# Patient Record
Sex: Female | Born: 1980 | Race: White | Hispanic: No | Marital: Married | Smoking: Never smoker
Health system: Southern US, Community
[De-identification: ages and names within clinical notes are randomized; demographics above are authoritative.]

## PROBLEM LIST (undated history)

## (undated) DIAGNOSIS — T7840XA Allergy, unspecified, initial encounter: Secondary | ICD-10-CM

## (undated) HISTORY — DX: Allergy, unspecified, initial encounter: T78.40XA

## (undated) HISTORY — PX: TONSILLECTOMY: SUR1361

---

## 2003-09-25 ENCOUNTER — Emergency Department (HOSPITAL_COMMUNITY): Admission: EM | Admit: 2003-09-25 | Discharge: 2003-09-25 | Payer: Self-pay | Admitting: Emergency Medicine

## 2006-01-17 ENCOUNTER — Ambulatory Visit: Payer: Self-pay | Admitting: Family Medicine

## 2006-02-11 ENCOUNTER — Other Ambulatory Visit: Admission: RE | Admit: 2006-02-11 | Discharge: 2006-02-11 | Payer: Self-pay | Admitting: Family Medicine

## 2006-02-11 ENCOUNTER — Encounter (INDEPENDENT_AMBULATORY_CARE_PROVIDER_SITE_OTHER): Payer: Self-pay | Admitting: Internal Medicine

## 2006-02-11 ENCOUNTER — Ambulatory Visit: Payer: Self-pay | Admitting: Family Medicine

## 2006-02-11 LAB — CONVERTED CEMR LAB: Pap Smear: NORMAL

## 2008-08-18 ENCOUNTER — Encounter (INDEPENDENT_AMBULATORY_CARE_PROVIDER_SITE_OTHER): Payer: Self-pay | Admitting: Internal Medicine

## 2008-08-18 DIAGNOSIS — K219 Gastro-esophageal reflux disease without esophagitis: Secondary | ICD-10-CM | POA: Insufficient documentation

## 2009-08-10 ENCOUNTER — Ambulatory Visit: Payer: Self-pay | Admitting: Family Medicine

## 2009-08-10 DIAGNOSIS — J111 Influenza due to unidentified influenza virus with other respiratory manifestations: Secondary | ICD-10-CM

## 2010-08-08 NOTE — Letter (Signed)
Summary: Out of Work  [facility]  [address] [place], [place] [address]   Phone: [phone]  Fax: [phone]    August 10, 2009   Employee:  LELER BRION Mara    To Whom It May Concern:   For Medical reasons, please excuse the above named employee from work for the following dates:  Start:   August 10, 2009  End:   August 15, 2009  If you need additional information, please feel free to contact our office.         Sincerely,    Ruthe Mannan MD

## 2010-08-08 NOTE — Assessment & Plan Note (Signed)
Summary: flu???/per dr. Alphonsus Sias alc   Vital Signs:  Patient profile:   30 year old female Height:      66 inches Weight:      125.50 pounds BMI:     20.33 Temp:     99 degrees F oral Pulse rate:   88 / minute Pulse rhythm:   regular BP sitting:   88 / 62  (left arm) Cuff size:   regular  Vitals Entered By: Delilah Shan CMA Duncan Dull) (August 10, 2009 9:42 AM) CC: ? Flu ? per Dr. Alphonsus Sias   History of Present Illness: 30 yo female with 1 day of subjective fevers, body aches, sneezing, and dry cough. Extremem malaise, woke up with symptoms yeterday. Friend who she was with all weekend diagnosed with influenza B. No wheezing, shortness of breath, n/v/d. No increased work of breathing.  Current Medications (verified): 1)  Multivitamins  Tabs (Multiple Vitamin) .... Take One By Mouth Daily 2)  Nuvaring 0.12-0.015 Mg/24hr Ring (Etonogestrel-Ethinyl Estradiol) 3)  Tamiflu 75 Mg Caps (Oseltamivir Phosphate) .... Take One Capsule By Mouth Twice A Day X 5 Days  Allergies (verified): No Known Drug Allergies  Review of Systems      See HPI General:  Complains of chills, fever, malaise, and sweats. ENT:  Denies earache, nasal congestion, postnasal drainage, sinus pressure, and sore throat. Resp:  Complains of cough; denies shortness of breath, sputum productive, and wheezing. GI:  Denies abdominal pain, diarrhea, nausea, and vomiting.  Physical Exam  General:  Alert, NAD, no inc WOB Non toxic appear. VSS Ears:  External ear exam shows no significant lesions or deformities.  Otoscopic examination reveals clear canals, tympanic membranes are intact bilaterally without bulging, retraction, inflammation or discharge. Hearing is grossly normal bilaterally. Nose:  External nasal examination shows no deformity or inflammation. Nasal mucosa are pink and moist without lesions or exudates. Mouth:  pharynx pink and moist.   MMM Lungs:  Normal respiratory effort, chest expands symmetrically.  Lungs are clear to auscultation, no crackles or wheezes. Heart:  Normal rate and regular rhythm. S1 and S2 normal without gallop, murmur, click, rub or other extra sounds. Psych:  Cognition and judgment appear intact. Alert and cooperative with normal attention span and concentration. No apparent delusions, illusions, hallucinations   Impression & Recommendations:  Problem # 1:  INFLUENZA LIKE ILLNESS (ICD-487.1) Assessment New Given abrupt symptoms less than 48 hours ago and influenza exposure will treat with Tamiflu. Given red flag respiratory symptoms requiring immediate evaluation.    Complete Medication List: 1)  Multivitamins Tabs (Multiple vitamin) .... Take one by mouth daily 2)  Nuvaring 0.12-0.015 Mg/24hr Ring (Etonogestrel-ethinyl estradiol) 3)  Tamiflu 75 Mg Caps (Oseltamivir phosphate) .... Take one capsule by mouth twice a day x 5 days Prescriptions: TAMIFLU 75 MG CAPS (OSELTAMIVIR PHOSPHATE) Take one capsule by mouth twice a day x 5 days  #10 x 0   Entered and Authorized by:   Ruthe Mannan MD   Signed by:   Ruthe Mannan MD on 08/10/2009   Method used:   Electronically to        [id number]       [address]       [place], [place]  [address]       Ph: [phone]       Fax: [phone]   RxID:   [id number]   Current Allergies (reviewed today): No known allergies

## 2010-11-06 ENCOUNTER — Inpatient Hospital Stay (HOSPITAL_COMMUNITY)
Admission: AD | Admit: 2010-11-06 | Discharge: 2010-11-06 | Disposition: A | Discharge status: Home / Self Care | Payer: Medicaid Other | Source: Ambulatory Visit | Attending: Obstetrics and Gynecology | Admitting: Obstetrics and Gynecology

## 2010-11-06 DIAGNOSIS — O479 False labor, unspecified: Secondary | ICD-10-CM | POA: Insufficient documentation

## 2010-11-06 LAB — URINALYSIS, ROUTINE W REFLEX MICROSCOPIC
Ketones, ur: NEGATIVE mg/dL
Nitrite: NEGATIVE
Specific Gravity, Urine: 1.02 (ref 1.005–1.030)
Urobilinogen, UA: 0.2 mg/dL (ref 0.0–1.0)
pH: 7 (ref 5.0–8.0)

## 2010-11-07 ENCOUNTER — Inpatient Hospital Stay (HOSPITAL_COMMUNITY)
Admission: AD | Admit: 2010-11-07 | Discharge: 2010-11-09 | DRG: 775 | Disposition: A | Discharge status: Home / Self Care | Payer: Medicaid Other | Source: Ambulatory Visit | Attending: Obstetrics and Gynecology | Admitting: Obstetrics and Gynecology

## 2010-11-07 LAB — CBC
Hemoglobin: 13.1 g/dL (ref 12.0–15.0)
MCHC: 33.8 g/dL (ref 30.0–36.0)
Platelets: 229 10*3/uL (ref 150–400)

## 2010-11-07 LAB — RPR: RPR Ser Ql: NONREACTIVE

## 2010-11-08 LAB — CBC
HCT: 34.7 % — ABNORMAL LOW (ref 36.0–46.0)
Platelets: 210 10*3/uL (ref 150–400)
RDW: 14.7 % (ref 11.5–15.5)
WBC: 16.2 10*3/uL — ABNORMAL HIGH (ref 4.0–10.5)

## 2010-11-09 NOTE — Discharge Summary (Signed)
  Joyce Warren, Joyce Warren                   ACCOUNT NO.:  [id number]  MEDICAL RECORD NO.:  [id number]           PATIENT TYPE:  I  LOCATION:  9141                          FACILITY:  [facility]  PHYSICIAN:  Gerrit Friends. Aldona Bar, M.D.   DATE OF BIRTH:  06/12/1981  DATE OF ADMISSION:  11/07/2010 DATE OF DISCHARGE:  11/09/2010                              DISCHARGE SUMMARY   DISCHARGE DIAGNOSES: 1. Term pregnancy, delivered a 6-pounds-15-ounce female infant, Apgars     8 and 9. 2. Blood type O positive.  PROCEDURE: 1. Vacuum extraction-assisted delivery. 2. Vaginal and labial tears, repaired.  SUMMARY:  This 30 year old primigravida with a due date of November 04, 2010, was admitted in labor on Nov 07, 2010, after an uncomplicated pregnancy.  She progressed and required the assistance of a vacuum extractor to facilitate delivery after pushing for approximately 2 hours.  She was delivered of a 6-pounds-15-ounce female infant with Apgars of 8 and 9 and bilateral vaginal and labial tears were repaired without difficulty.  Her postpartum course was benign.  Her discharge hemoglobin was 11.0 with a white count of 16,200 and a platelet count of 210,000.  On the morning of Nov 09, 2010, she was ambulating well, tolerating a regular diet well, having normal bowel and bladder function, and was afebrile.  Her breastfeeding was going well and she was desirous of discharge.  Accordingly she was given a discharge brochure and understood all instructions well.  DISCHARGE MEDICATIONS: 1. Vitamins - 1 a day as long she is breastfeeding. 2. She was given a prescription for Motrin 600 mg to use every 6 hours     as needed for cramping or mild pain. 3. Tylox 1-2 every 4-6 h. as needed for more severe pain.  She will return to the office for followup in approximately 4 weeks' time or as needed.  CONDITION ON DISCHARGE:  Improved.     Gerrit Friends. Aldona Bar, M.D.     RMW/MEDQ  D:  11/09/2010  T:  11/09/2010  Job:   [id number]  Electronically Signed by Annamaria Helling M.D. on 11/09/2010 12:19:12 PM

## 2010-11-24 NOTE — Assessment & Plan Note (Signed)
[facility]                             [facility] OFFICE NOTE   Joyce Warren, Joyce Warren                          MRN:          [id number]  DATE:01/17/2006                            DOB:          05/10/1981    [name] is a 30 year old white female who comes to establish with the  practice due to question of reflux.   She indicates that she has heartburn with tomato-based products for  approximately 2 years.  During the last month, everything is now beginning  to cause heartburn including water.  She wakes with a sore throat at times  and has had a hot water brash at night at times.  She has used Gaviscon and  Tums with minimal improvement.  It is of note that her mother also has GERD  and is currently on Nexium.  She indicates that her bowel movements are  normal.   She has been being seen at [facility]; however, due to  their turnover, she is transferring her care.   CURRENT MEDICATIONS:  Multivitamin daily.   ALLERGIES:  None known.   PAST MEDICAL HISTORY:  1.  Chickenpox as a child.  2.  Pneumonia as a child.  3.  Her only surgeries have included a tonsillectomy in 1989 and extraction      of her wisdom teeth more recently.  4.  She has had no hospitalizations.   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  She is applying to do a lateral entry into elementary education.  She has no children, is in no exercise program, has never smoked.  No  alcohol or street drugs.   REVIEW OF SYSTEMS:  Basically negative including HEENT, CARDIOVASCULAR,  Respiratory, MUSCULOSKELETAL.  She does wear glasses for nearsightedness and  had her last eye exam approximately 2 years ago.  At that time, she needed a  new prescription and was negative for cataracts or glaucoma.  GI:  Other  than her reflux, she indicates no positive review.  GU:  She has had urinary  tract infection, with the most recent  approximately a year ago.  She has no  history of renal stones.  GYNECOLOGIC:  She is a nullipara, has never had a  Pap smear and has never been sexually active.  ALLERGIES:  She has allergic  rhinitis, spring and fall, for which she uses Claritin.  HEMATOLOGIC:  She  has had no thromboses.   FAMILY HISTORY:  Father is living at age 40; he had his first myocardial  infarction at age 50, has had bypass surgery; he has hypertension and is  diabetic.  Her mother is living at age 8 with reflux.  Both of her paternal  grandparents have died; she is unsure of the reason for her grandfather's  death; her grandmother died of some form of lung cancer.  Both of her  maternal grandparents are living, interestingly, with her maternal  grandfather having osteoporosis; she is not sure why this has occurred.  Her  maternal grandmother has hypertension.  She has 1 brother living and well.   Questions regarding the extended family find strong family history of  myocardial infarctions and heart problems on the father's side of the  family; her mother's side of the family has diabetes.  To her knowledge,  there is no further cancer in the family including breast, ovarian, uterine,  prostate or colon.  There is no depression, drug or alcohol abuse in the  family.   IMMUNIZATION RECORDS:  Her last tetanus was in 1998.  She has had the  hepatitis B vaccine.   PHYSICAL EXAM:  VITAL SIGNS:  Blood pressure 110/70, temperature 98.2, pulse  of 72.  Weight is 132, height 5 feet 5-1/2 inches.  GENERAL:  Well-developed, well-nourished white female in no acute distress.  CHEST:  No rales, rhonchi or wheezes.  HEART:  Rate and rhythm regular without murmurs, gallops or rubs.  No  carotid bruits.  No pretibial edema.  ABDOMEN:  Soft and flat with tenderness, greatest in the midepigastric  region, but extends to the left upper quadrant and to some extent, the upper  portion of the left lower quadrant.  She has some  minimal pain over the  right upper quadrant.  There are no masses.  Bowel sounds are heard  throughout.  No hernias are identified.  RECTAL:  Stool is heme-negative and no rectal lesions are noted.  MUSCULOSKELETAL:  Muscle mass is equal throughout upper and lower  extremities.  Gait and station within normal limits.  SKIN:  Without obvious lesion in the exposed area.  PSYCHIATRIC:  Oriented x3, verbalizes quite easily.   ASSESSMENT:  1.  Gastroesophageal reflux disease.  Plan:  We will start her on Nexium 40      mg with samples x3 weeks.  I have also reviewed and given her copies of      the gastroesophageal reflux disease precautions.  We will see her back      in 3 weeks for further evaluation and possible referral to      Gastroenterology.  2.  Health maintenance:  She is in need of a Pap smear at age 67, despite      the fact that she is not sexually activity.  She also needs a DT update.      We will schedule her back to do this and fasting laboratories in 3      weeks.  3.  Family history of diabetes, coronary artery disease and osteoporosis.      We discussed prevention of all of these to some limited extent at this      present time.                                   Trevor Iha, FNP-C                                Arta Silence, MD   BJB/MedQ  DD:  01/18/2006  DT:  01/18/2006  Job #:  [id number]

## 2010-12-08 ENCOUNTER — Other Ambulatory Visit: Payer: Self-pay | Admitting: Obstetrics & Gynecology

## 2011-01-02 ENCOUNTER — Other Ambulatory Visit: Payer: Self-pay | Admitting: Obstetrics & Gynecology

## 2014-09-20 ENCOUNTER — Ambulatory Visit (INDEPENDENT_AMBULATORY_CARE_PROVIDER_SITE_OTHER): Payer: Self-pay | Admitting: Physician Assistant

## 2014-09-20 VITALS — BP 110/66 | HR 71 | Temp 99.2°F | Resp 18 | Ht 66.5 in | Wt 153.0 lb

## 2014-09-20 DIAGNOSIS — J309 Allergic rhinitis, unspecified: Secondary | ICD-10-CM

## 2014-09-20 DIAGNOSIS — J019 Acute sinusitis, unspecified: Secondary | ICD-10-CM

## 2014-09-20 MED ORDER — AMOXICILLIN-POT CLAVULANATE 875-125 MG PO TABS: 1.0000 | ORAL_TABLET | Freq: Two times a day (BID) | ORAL | Status: AC

## 2014-09-20 MED ORDER — IPRATROPIUM BROMIDE 0.03 % NA SOLN: 2.0000 | Freq: Two times a day (BID) | NASAL | Status: AC

## 2014-09-20 NOTE — Progress Notes (Signed)
  Medical screening examination/treatment/procedure(s) were performed by non-physician practitioner and as supervising physician I was immediately available for consultation/collaboration.     

## 2014-09-20 NOTE — Progress Notes (Signed)
Subjective:    Patient ID: Joyce Warren, female    DOB: 10/29/1980, 34 y.o.   MRN: [id number]  HPI  This is a 34 year old female presenting with cough and nasal congestion x 1 week. Cough is dry. Having bilateral ear pain. Having some morning sore throat that resolves throughout the day. Nasal discharge is thick and green. Having sinus pressure and upper teeth pain. Has had low-grade fevers and malaise. Taking mucinex and zyrtec-D with minimal relief. Has had sinus infections before but hasn't had one in over a year. Has seasonal allergies and takes zyrtec as needed  - didn't start taking until symptoms started. Typically bothered by allergic symptoms in the spring time. She does not have a history of asthma and is not a smoker.  Review of Systems  Constitutional: Positive for fever and fatigue. Negative for chills.  HENT: Positive for congestion, ear pain, sinus pressure and sore throat.   Eyes: Negative for redness.  Respiratory: Positive for cough. Negative for shortness of breath and wheezing.   Gastrointestinal: Negative for nausea and vomiting.  Skin: Negative for rash.  Allergic/Immunologic: Positive for environmental allergies.  Neurological: Negative for dizziness.    Patient Active Problem List   Diagnosis Date Noted  . GERD 08/18/2008   Home meds: None  No Known Allergies  Patient's social and family history were reviewed.     Objective:   Physical Exam  Constitutional: She is oriented to person, place, and time. She appears well-developed and well-nourished. No distress.  HENT:  Head: Normocephalic and atraumatic.  Right Ear: Hearing, external ear and ear canal normal. Tympanic membrane is retracted.  Left Ear: External ear and ear canal normal. Tympanic membrane is retracted.  Nose: Mucosal edema present. Right sinus exhibits maxillary sinus tenderness and frontal sinus tenderness. Left sinus exhibits maxillary sinus tenderness and frontal sinus tenderness.    Maxillary tenderness > frontal tenderness  Eyes: Conjunctivae and lids are normal. Right eye exhibits no discharge. Left eye exhibits no discharge. No scleral icterus.  Cardiovascular: Normal rate, regular rhythm, normal heart sounds, intact distal pulses and normal pulses.   No murmur heard. Pulmonary/Chest: Effort normal and breath sounds normal. No respiratory distress. She has no wheezes. She has no rhonchi. She has no rales.  Musculoskeletal: Normal range of motion.  Lymphadenopathy:       Head (right side): No submental, no submandibular and no tonsillar adenopathy present.       Head (left side): No submental, no submandibular and no tonsillar adenopathy present.    She has cervical adenopathy (right anterior).  Neurological: She is alert and oriented to person, place, and time.  Skin: Skin is warm, dry and intact. No lesion and no rash noted.  Psychiatric: She has a normal mood and affect. Her speech is normal and behavior is normal. Thought content normal.  BP 110/66 mmHg  Pulse 71  Temp(Src) 99.2 F (37.3 C) (Oral)  Resp 18  Ht 5' 6.5" (1.689 m)  Wt 153 lb (69.4 kg)  BMI 24.33 kg/m2  SpO2 99%  LMP 08/31/2014     Assessment & Plan:  1. Acute sinusitis, recurrence not specified, unspecified location 2. Allergic rhinitis Will treat with augmentin and atrovent nasal spray. Advised pt to start taking zyrtec daily during the spring season. She will return in 7-10 days if symptoms are not improving.  - amoxicillin-clavulanate (AUGMENTIN) 875-125 MG per tablet; Take 1 tablet by mouth 2 (two) times daily.  Dispense: 20 tablet; Refill:  0 - ipratropium (ATROVENT) 0.03 % nasal spray; Place 2 sprays into both nostrils 2 (two) times daily.  Dispense: 30 mL; Refill: 0   Laticha Ferrucci V. Dyke Brackett, MHS [facility]  09/20/2014

## 2014-09-20 NOTE — Patient Instructions (Signed)
Take antibiotic until finished. Use nasal spray twice a day until symptoms have resolved. Apply vaseline with a cotton swab if your nasal mucosa is dry. Continue to take zyrtec daily and consistently throughout the allergy season. Return in 7-10 days if your symptoms are not improving.

## 2016-05-16 ENCOUNTER — Emergency Department (HOSPITAL_COMMUNITY)
Admission: EM | Admit: 2016-05-16 | Discharge: 2016-05-16 | Disposition: A | Discharge status: Home / Self Care | Payer: BLUE CROSS/BLUE SHIELD | Attending: Emergency Medicine | Admitting: Emergency Medicine

## 2016-05-16 ENCOUNTER — Encounter (HOSPITAL_COMMUNITY): Payer: Self-pay | Admitting: Emergency Medicine

## 2016-05-16 ENCOUNTER — Emergency Department (HOSPITAL_COMMUNITY): Payer: BLUE CROSS/BLUE SHIELD

## 2016-05-16 DIAGNOSIS — M25512 Pain in left shoulder: Secondary | ICD-10-CM | POA: Insufficient documentation

## 2016-05-16 DIAGNOSIS — R0789 Other chest pain: Secondary | ICD-10-CM | POA: Insufficient documentation

## 2016-05-16 LAB — I-STAT TROPONIN, ED
TROPONIN I, POC: 0 ng/mL (ref 0.00–0.08)
Troponin i, poc: 0 ng/mL (ref 0.00–0.08)

## 2016-05-16 LAB — CBC
HCT: 40.1 % (ref 36.0–46.0)
HEMOGLOBIN: 13.6 g/dL (ref 12.0–15.0)
MCH: 27.5 pg (ref 26.0–34.0)
MCHC: 33.9 g/dL (ref 30.0–36.0)
MCV: 81.2 fL (ref 78.0–100.0)
Platelets: 300 10*3/uL (ref 150–400)
RBC: 4.94 MIL/uL (ref 3.87–5.11)
RDW: 12.9 % (ref 11.5–15.5)
WBC: 7.6 10*3/uL (ref 4.0–10.5)

## 2016-05-16 LAB — BASIC METABOLIC PANEL WITH GFR
Anion gap: 10 (ref 5–15)
BUN: 10 mg/dL (ref 6–20)
CO2: 22 mmol/L (ref 22–32)
Calcium: 9.6 mg/dL (ref 8.9–10.3)
Chloride: 106 mmol/L (ref 101–111)
Creatinine, Ser: 0.93 mg/dL (ref 0.44–1.00)
GFR calc Af Amer: >60 mL/min
GFR calc non Af Amer: >60 mL/min
Glucose, Bld: 103 mg/dL — ABNORMAL HIGH (ref 65–99)
Potassium: 3.6 mmol/L (ref 3.5–5.1)
Sodium: 138 mmol/L (ref 135–145)

## 2016-05-16 MED ORDER — ASPIRIN 81 MG PO CHEW: 81.0000 mg | CHEWABLE_TABLET | Freq: Every day | ORAL | 1 refills | Status: AC

## 2016-05-16 NOTE — ED Provider Notes (Signed)
[facility]-EMERGENCY DEPT Provider Note   CSN: [id number] Arrival date & time: 05/16/16  1832     History   Chief Complaint Chief Complaint  Patient presents with  . Chest Pain    HPI Joyce Warren is a 35 y.o. female who presents emergency Department with chief complaint of chest pressure and left arm pain. Is no significant past medical history. The patient states that 5 days ago she was sitting at her desk at work when she had onset of mild dyspnea lasting for approximately 15 minutes. She states that she noted how strange sensation was because she was not being active. She states that it resolved. She noticed later that day that she had some aching in the left shoulder with radiating to her pain down the left arm. Since that time she has had consistent sensation of chest pressure, mild worsening of dyspnea on exertion. The achiness in her shoulder is worse at rest. She denies paresthesia, jaw pain. She has noticed some pressure in her left shoulder blade. She has one risk factor for cardiac disease which is a strong family history of early heart attack on her father's side. Her father had his first heart attack at 6336. All of his female siblings had heart attacks in her 30s and all of his female siblings had heart attacks in their 4740s. Patient has no history of DVT or PE. She has a recent fevers, cough. She does not take oral contraceptives. She has a smoking history. No recent confinement or travel. She denies any previous injuries, or surgeries.  HPI  Past Medical History:  Diagnosis Date  . Allergy     Patient Active Problem List   Diagnosis Date Noted  . Rhinitis, allergic 09/20/2014  . GERD 08/18/2008    Past Surgical History:  Procedure Laterality Date  . TONSILLECTOMY      OB History    No data available       Home Medications    Prior to Admission medications   Medication Sig Start Date End Date Taking? Authorizing Provider  cetirizine (ZYRTEC) 10 MG tablet Take 10 mg  by mouth daily.   Yes Historical Provider, MD  ipratropium (ATROVENT) 0.03 % nasal spray Place 2 sprays into both nostrils 2 (two) times daily. Patient not taking: Reported on 05/16/2016 09/20/14   Dorna LeitzNicole V Bush, PA-C    Family History Family History  Problem Relation Age of Onset  . Diabetes Father   . Heart disease Father   . Hyperlipidemia Father   . Cancer Maternal Grandfather   . Cancer Paternal Grandmother   . Heart disease Paternal Grandmother   . Hyperlipidemia Paternal Grandmother   . Heart disease Paternal Grandfather     Social History Social History  Substance Use Topics  . Smoking status: Never Smoker  . Smokeless tobacco: Never Used  . Alcohol use 0.0 oz/week     Comment: occ     Allergies   Patient has no known allergies.   Review of Systems Review of Systems   Physical Exam Updated Vital Signs BP 107/78   Pulse 67   Temp 98.6 F (37 C) (Oral)   Resp 13   Ht 5\' 6"  (1.676 m)   Wt 68 kg   LMP 05/14/2016   SpO2 100%   BMI 24.21 kg/m   Physical Exam  Constitutional: She is oriented to person, place, and time. She appears well-developed and well-nourished. No distress.  HENT:  Head: Normocephalic and atraumatic.  Eyes: Conjunctivae  are normal. No scleral icterus.  Neck: Normal range of motion.  Cardiovascular: Normal rate, regular rhythm and normal heart sounds.  Exam reveals no gallop and no friction rub.   No murmur heard. Pulmonary/Chest: Effort normal and breath sounds normal. No respiratory distress. She exhibits no tenderness.  Abdominal: Soft. Bowel sounds are normal. She exhibits no distension and no mass. There is no tenderness. There is no guarding.  Musculoskeletal:  Tenderness to palpation of the left shoulder region, left shoulder pain is reproducible with range of motion  Neurological: She is alert and oriented to person, place, and time.  Skin: Skin is warm and dry. She is not diaphoretic.  Nursing note and vitals  reviewed.    ED Treatments / Results  Labs (all labs ordered are listed, but only abnormal results are displayed) Labs Reviewed  BASIC METABOLIC PANEL - Abnormal; Notable for the following:       Result Value   Glucose, Bld 103 (*)    All other components within normal limits  CBC  I-STAT TROPOININ, ED  I-STAT TROPOININ, ED    EKG  EKG Interpretation  Date/Time:  Wednesday May 16 2016 21:31:32 EST Ventricular Rate:  62 PR Interval:  134 QRS Duration: 103 QT Interval:  397 QTC Calculation: 404 R Axis:   90 Text Interpretation:  Sinus rhythm Borderline right axis deviation Abnormal R-wave progression, early transition Since last tracing of earlier today No old tracing to compare Confirmed by [facility] CenterWENTZ  MD, ELLIOTT [phone]([id number]) on 05/16/2016 10:20:55 PM       Radiology Dg Chest 2 View  Result Date: 05/16/2016 CLINICAL DATA:  Shortness of breath and chest tightness EXAM: CHEST  2 VIEW COMPARISON:  None. FINDINGS: Lungs appear slightly hyperinflated. The heart size and mediastinal contours are within normal limits. Both lungs are clear. The visualized skeletal structures are unremarkable. IMPRESSION: Mild hyperinflation.  No focal pulmonary infiltrate. Electronically Signed   By: Jasmine PangKim  Fujinaga M.D.   On: 05/16/2016 18:59    Procedures Procedures (including critical care time)  Medications Ordered in ED Medications - No data to display   Initial Impression / Assessment and Plan / ED Course  I have reviewed the triage vital signs and the nursing notes.  Pertinent labs & imaging results that were available during my care of the patient were reviewed by me and considered in my medical decision making (see chart for details).  Clinical Course    Patient with chest pressure and reproducible shoulder pain. Her Heart score is 2 with 2 negative troponins, Her EKG shows no signs of ischemia. PERC negative. Patient ambulated in the ED with no change in 02 sats. Patient appears safe  for discharge. I will have the patient follow up with her PCP and cardiology for OP stress test. She may start on 1 baby asa daily.   Final Clinical Impressions(s) / ED Diagnoses   Final diagnoses:  Chest tightness or pressure    New Prescriptions New Prescriptions   ASPIRIN 81 MG CHEWABLE TABLET    Chew 1 tablet (81 mg total) by mouth daily.     Arthor Captainbigail Tewana Bohlen, PA-C 05/16/16 2238    Mancel BaleElliott Wentz, MD 05/16/16 [id number]

## 2016-05-16 NOTE — ED Notes (Signed)
PA-C at bedside 

## 2016-05-16 NOTE — Discharge Instructions (Addendum)

## 2016-05-16 NOTE — ED Notes (Signed)
Ambulated pt around the ED, SpO2 maintained between 98-100% RA. Patient appeared to be in no distress and ambulated with steady gait. PA-C made aware.

## 2016-05-16 NOTE — ED Notes (Signed)
Pt verbalized understanding discharge instructions and denies any further needs or questions at this time. VS stable, ambulatory and steady gait.   

## 2016-05-16 NOTE — ED Triage Notes (Signed)
Pt states she started having CP and some SOB at work the other day. Pain goes into left arm. Pain has been intermittent. Pt has been diaphoretic with the pain at times as well.

## 2017-11-02 IMAGING — DX DG CHEST 2V
2 series · 2 of 2 positions shown · non-contrast
Comparison: None.

CLINICAL DATA: Shortness of breath and chest tightness

EXAM:
CHEST  2 VIEW

[chest pa]
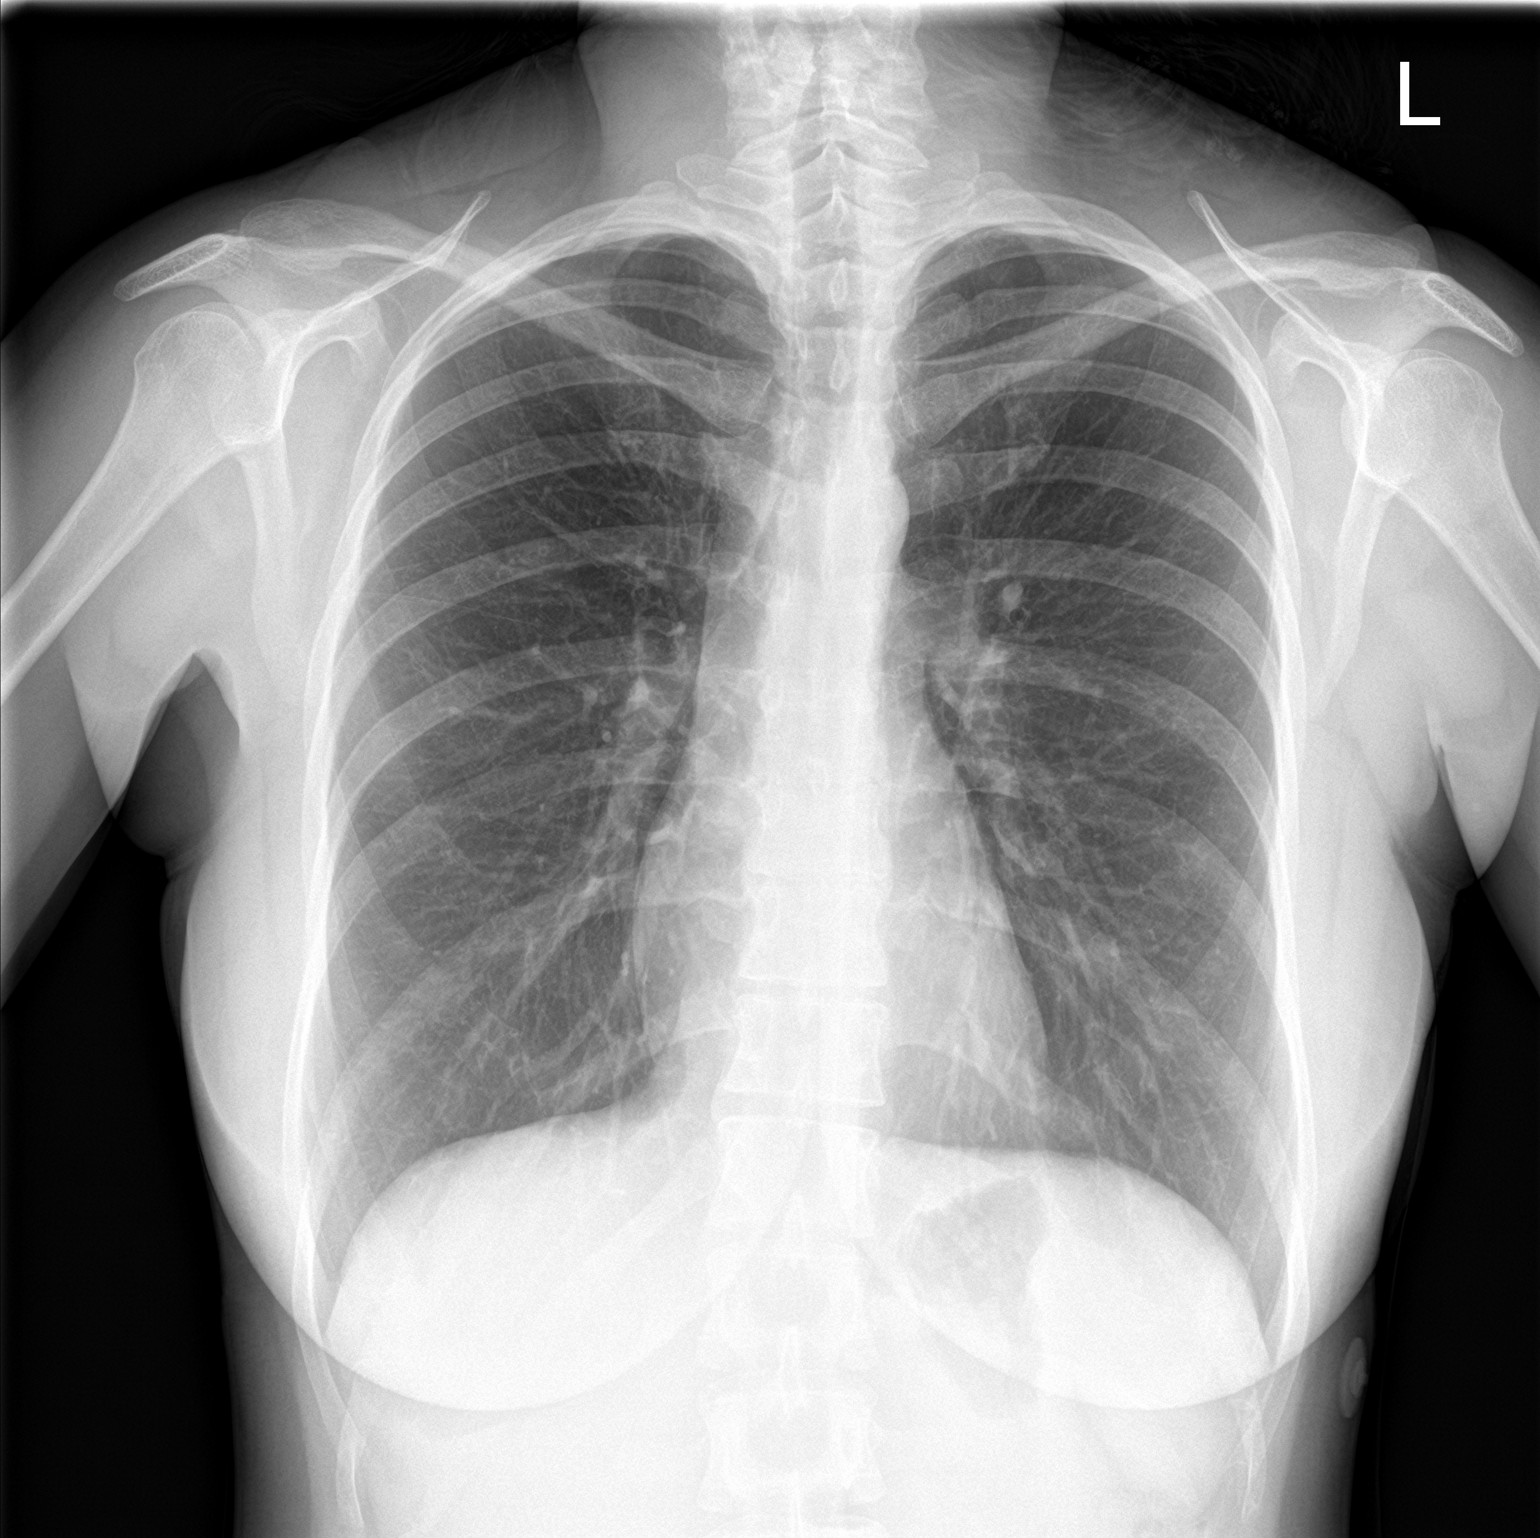

[chest lat]
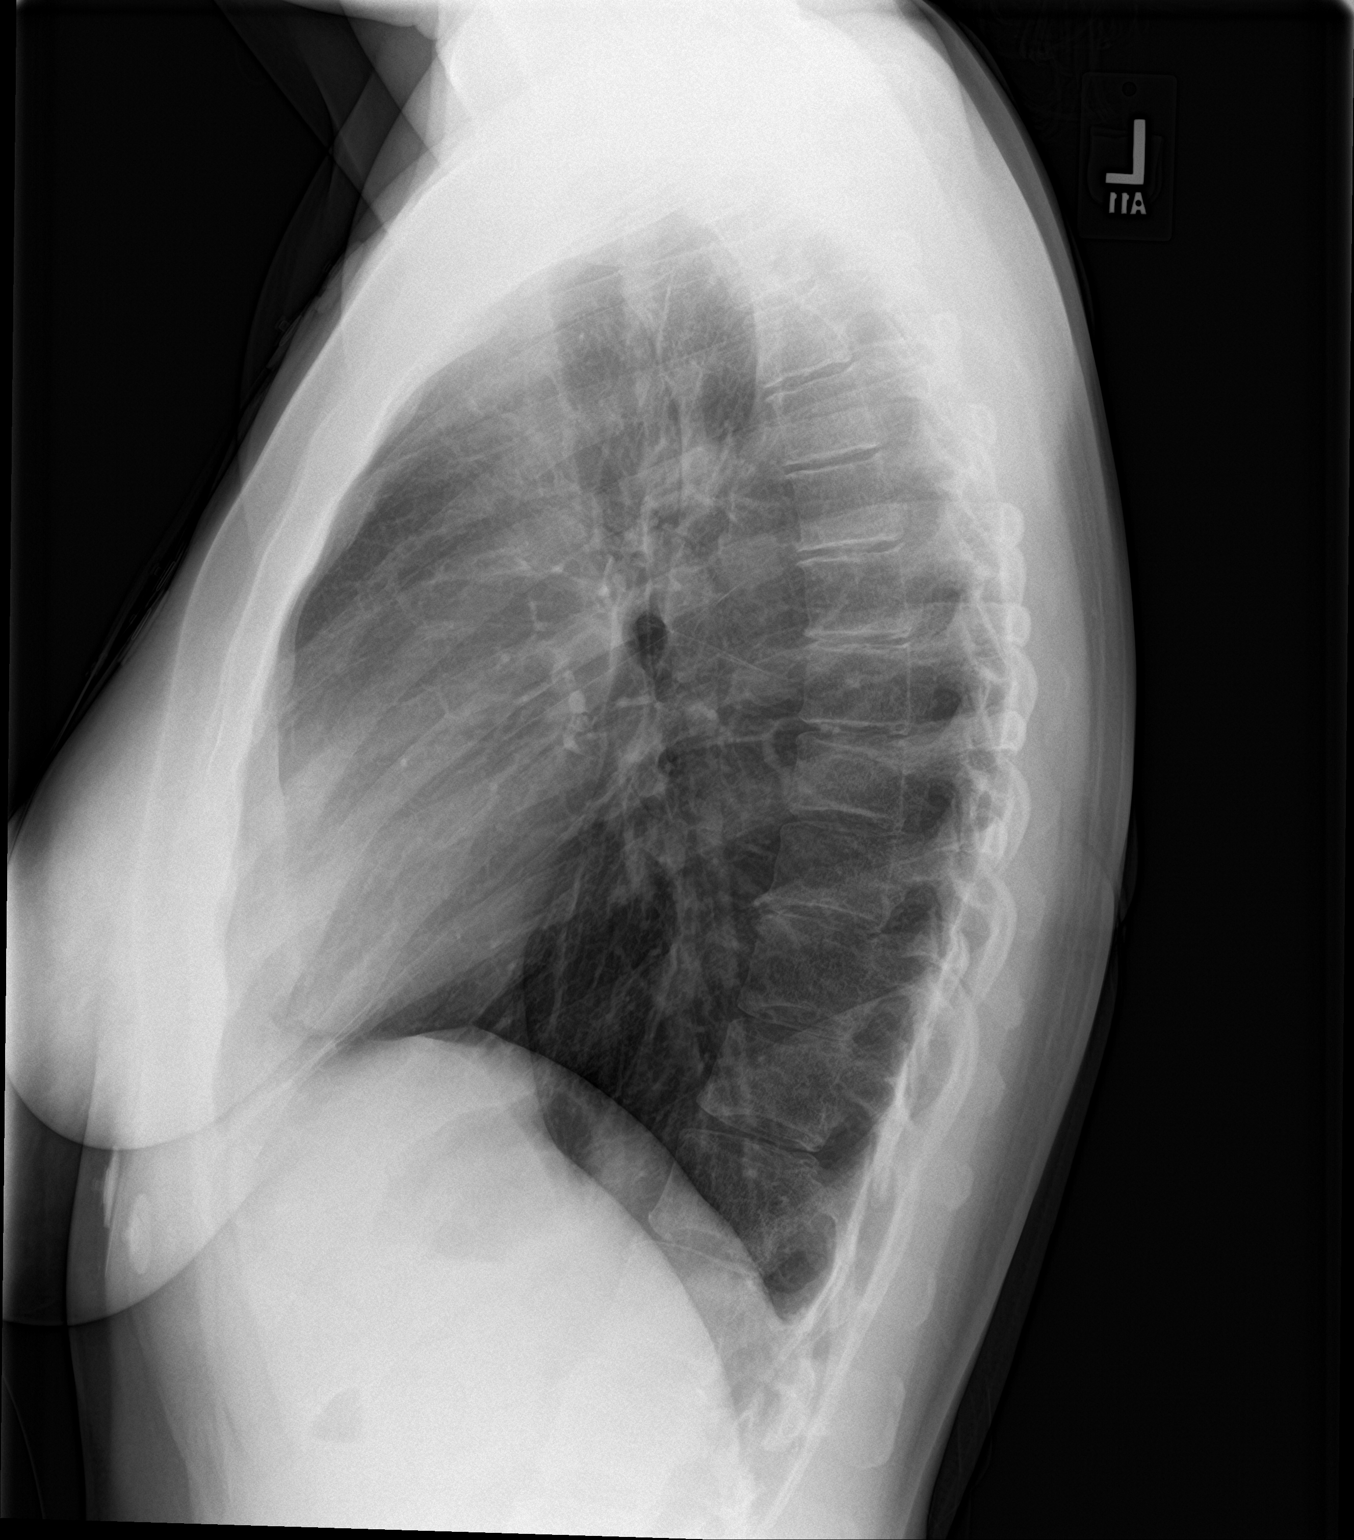

[2 of 2 positions shown; findings below may reference images not displayed]

FINDINGS: Lungs appear slightly hyperinflated. The heart size and mediastinal
contours are within normal limits. Both lungs are clear. The
visualized skeletal structures are unremarkable.
IMPRESSION: Mild hyperinflation.  No focal pulmonary infiltrate.

## 2019-09-26 ENCOUNTER — Ambulatory Visit: Payer: 59 | Attending: Internal Medicine

## 2019-09-26 DIAGNOSIS — Z23 Encounter for immunization: Secondary | ICD-10-CM

## 2019-09-26 NOTE — Progress Notes (Signed)
   Covid-19 Vaccination Clinic  Name:  Joyce Warren    MRN: [id number] DOB: [birth year]  09/26/2019  [name] was observed post Covid-19 immunization for 15 minutes without incident. She was provided with Vaccine Information Sheet and instruction to access the V-Safe system.   [name] was instructed to call 911 with any severe reactions post vaccine: Marland Kitchen Difficulty breathing  . Swelling of face and throat  . A fast heartbeat  . A bad rash all over body  . Dizziness and weakness   Immunizations Administered    Name Date Dose VIS Date Route   Moderna COVID-19 Vaccine 09/26/2019 12:05 PM 0.5 mL 06/09/2019 Intramuscular   Manufacturer: Moderna   Lot: [id number]   NDC: 73567-014-10

## 2019-11-04 ENCOUNTER — Ambulatory Visit: Payer: 59 | Attending: Internal Medicine

## 2019-11-04 DIAGNOSIS — Z23 Encounter for immunization: Secondary | ICD-10-CM

## 2019-11-04 NOTE — Progress Notes (Signed)
   [facility] Vaccination [facility]  Name:  Joyce Warren    MRN: [id number] DOB: [birth year]  11/04/2019  Ms. Joyce Warren was observed post Covid-19 immunization for 15 minutes without incident. She was provided with Vaccine Information Sheet and instruction to access the V-Safe system.   Ms. Joyce Warren was instructed to call 911 with any severe reactions post vaccine: Marland Kitchen Difficulty breathing  . Swelling of face and throat  . A fast heartbeat  . A bad rash all over body  . Dizziness and weakness   Immunizations Administered    Name Date Dose VIS Date Route   Moderna COVID-19 Vaccine 11/04/2019 12:09 PM 0.5 mL 06/2019 Intramuscular   Manufacturer: Moderna   Lot: [id number]   NDC: 00920-041-59

## 2020-05-05 ENCOUNTER — Other Ambulatory Visit: Payer: Self-pay

## 2020-05-05 DIAGNOSIS — Z0000 Encounter for general adult medical examination without abnormal findings: Secondary | ICD-10-CM

## 2020-05-05 DIAGNOSIS — R7309 Other abnormal glucose: Secondary | ICD-10-CM

## 2020-05-06 LAB — CBC WITH DIFFERENTIAL/PLATELET
Absolute Monocytes: 832 cells/uL (ref 200–950)
Basophils Absolute: 128 cells/uL (ref 0–200)
Basophils Relative: 1.6 %
Eosinophils Absolute: 480 cells/uL (ref 15–500)
Eosinophils Relative: 6 %
HCT: 50.3 % — ABNORMAL HIGH (ref 35.0–45.0)
Hemoglobin: 16.2 g/dL — ABNORMAL HIGH (ref 11.7–15.5)
Lymphs Abs: 3432 cells/uL (ref 850–3900)
MCH: 28.4 pg (ref 27.0–33.0)
MCHC: 32.2 g/dL (ref 32.0–36.0)
MCV: 88.2 fL (ref 80.0–100.0)
MPV: 9.5 fL (ref 7.5–12.5)
Monocytes Relative: 10.4 %
Neutro Abs: 3128 cells/uL (ref 1500–7800)
Neutrophils Relative %: 39.1 %
Platelets: 289 10*3/uL (ref 140–400)
RBC: 5.7 10*6/uL — ABNORMAL HIGH (ref 3.80–5.10)
RDW: 12.5 % (ref 11.0–15.0)
Total Lymphocyte: 42.9 %
WBC: 8 10*3/uL (ref 3.8–10.8)

## 2020-05-06 LAB — COMPREHENSIVE METABOLIC PANEL
AG Ratio: 1.9 (calc) (ref 1.0–2.5)
ALT: 4 U/L — ABNORMAL LOW (ref 6–29)
AST: 14 U/L (ref 10–30)
Albumin: 4.5 g/dL (ref 3.6–5.1)
Alkaline phosphatase (APISO): 88 U/L (ref 31–125)
BUN: 10 mg/dL (ref 7–25)
CO2: 30 mmol/L (ref 20–32)
Calcium: 9.5 mg/dL (ref 8.6–10.2)
Chloride: 104 mmol/L (ref 98–110)
Creat: 0.67 mg/dL (ref 0.50–1.10)
Globulin: 2.4 g/dL (calc) (ref 1.9–3.7)
Glucose, Bld: 85 mg/dL (ref 65–99)
Potassium: 4.2 mmol/L (ref 3.5–5.3)
Sodium: 141 mmol/L (ref 135–146)
Total Bilirubin: 0.7 mg/dL (ref 0.2–1.2)
Total Protein: 6.9 g/dL (ref 6.1–8.1)

## 2020-05-06 LAB — LIPID PANEL
Cholesterol: 235 mg/dL — ABNORMAL HIGH (ref <200)
HDL: 89 mg/dL (ref >=50)
LDL Cholesterol (Calc): 127 mg/dL (calc) — ABNORMAL HIGH
Non-HDL Cholesterol (Calc): 146 mg/dL (calc) — ABNORMAL HIGH (ref <130)
Total CHOL/HDL Ratio: 2.6 (calc) (ref <5.0)
Triglycerides: 91 mg/dL (ref <150)

## 2020-05-06 LAB — HEMOGLOBIN A1C
Hgb A1c MFr Bld: 5.1 % of total Hgb (ref <5.7)
Mean Plasma Glucose: 100 (calc)
eAG (mmol/L): 5.5 (calc)
